# Patient Record
Sex: Male | Born: 1965 | Race: White | Hispanic: No | Marital: Married | Smoking: Never smoker
Health system: Southern US, Community
[De-identification: ages and names within clinical notes are randomized; demographics above are authoritative.]

## PROBLEM LIST (undated history)

## (undated) DIAGNOSIS — I10 Essential (primary) hypertension: Secondary | ICD-10-CM

## (undated) HISTORY — PX: CHOLECYSTECTOMY: SHX55

---

## 2006-11-30 ENCOUNTER — Ambulatory Visit: Payer: Self-pay

## 2008-04-29 ENCOUNTER — Emergency Department: Payer: Self-pay | Admitting: Emergency Medicine

## 2009-12-17 ENCOUNTER — Ambulatory Visit: Payer: Self-pay

## 2010-04-23 ENCOUNTER — Ambulatory Visit: Payer: Self-pay | Admitting: Internal Medicine

## 2010-05-14 ENCOUNTER — Ambulatory Visit: Payer: Self-pay | Admitting: Internal Medicine

## 2010-05-23 ENCOUNTER — Ambulatory Visit: Payer: Self-pay | Admitting: Internal Medicine

## 2010-05-30 ENCOUNTER — Ambulatory Visit: Payer: Self-pay | Admitting: Internal Medicine

## 2010-06-23 ENCOUNTER — Ambulatory Visit: Payer: Self-pay | Admitting: Internal Medicine

## 2010-10-16 ENCOUNTER — Emergency Department: Payer: Self-pay | Admitting: Emergency Medicine

## 2011-02-02 ENCOUNTER — Ambulatory Visit: Payer: Self-pay | Admitting: Internal Medicine

## 2011-02-03 ENCOUNTER — Ambulatory Visit: Payer: Self-pay | Admitting: Internal Medicine

## 2011-02-13 ENCOUNTER — Ambulatory Visit: Payer: Self-pay | Admitting: Unknown Physician Specialty

## 2011-02-17 LAB — PATHOLOGY REPORT

## 2011-02-19 ENCOUNTER — Ambulatory Visit: Payer: Self-pay | Admitting: Surgery

## 2011-02-20 LAB — PATHOLOGY REPORT

## 2012-06-28 IMAGING — NM NUCLEAR MEDICINE HEPATOHBILIARY INCLUDE GB
5 series · 32 of 32 positions shown · non-contrast
Comparison: none

REASON FOR EXAM: abd pain mid
COMMENTS:

[Series 1000: gallbladder statics · 4.80mm/px · 1 of 1 slices shown (1 of 2)]
[im 1/1]
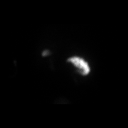

[Series 1000: gallbladder statics · 4.80mm/px · 9 of 9 slices shown (2 of 2)]
[im 1/9]
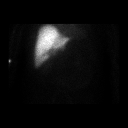
[im 2/9]
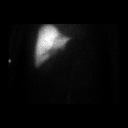
[im 3/9]
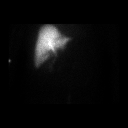
[im 4/9]
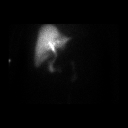
[im 5/9]
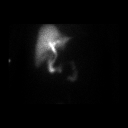
[im 6/9]
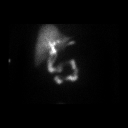
[im 7/9]
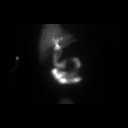
[im 8/9]
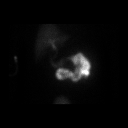
[im 9/9]
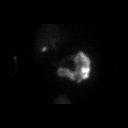

[Series 1000: gallbladder dynamic (results) · 4.80mm/px · 6 of 60 frames shown]
[frame 6/60]
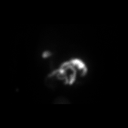
[frame 16/60]
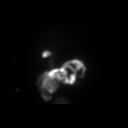
[frame 26/60]
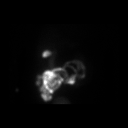
[frame 36/60]
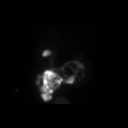
[frame 46/60]
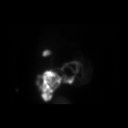
[frame 56/60]
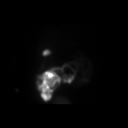

[Series 1000: gallbladder dynamic · 4.80mm/px · 6 of 60 frames shown]
[frame 6/60]
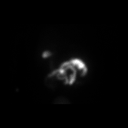
[frame 16/60]
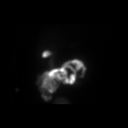
[frame 26/60]
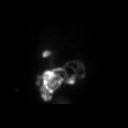
[frame 36/60]
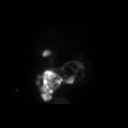
[frame 46/60]
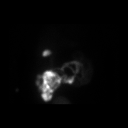
[frame 56/60]
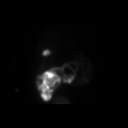

[Series 1000: gallbladder statics (concatenated) · 4.80mm/px · 10 of 10 slices shown]
[im 1/10]
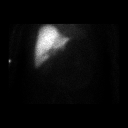
[im 2/10]
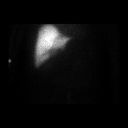
[im 3/10]
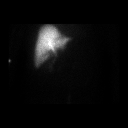
[im 4/10]
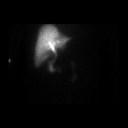
[im 5/10]
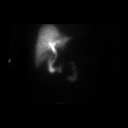
[im 6/10]
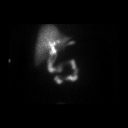
[im 7/10]
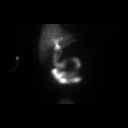
[im 8/10]
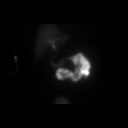
[im 9/10]
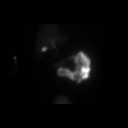
[im 10/10]
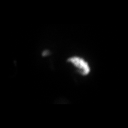

[32 of 32 positions shown; findings below may reference images not displayed]

PROCEDURE:     NM  - NM HEPATO WITH GB EJECT FRACTION  - February 03, 2011 [DATE]

RESULT:     Following intravenous administration of 8.16 mCi technetium 99m
Choletec, there is noted prompt visualization of tracer activity in the
liver at 3 minutes. At 70 minutes tracer activity is visualized in the
gallbladder, common duct and small bowel.

The gallbladder ejection fraction 30 minutes following instillation of a
sincalide drip measured 21%, which is below the normal range.
IMPRESSION: 1. There is visualization of tracer activity in the gallbladder, but
visualization is delayed and not seen until 70 minutes.
2. The gallbladder ejection fraction measures 21%, which is below the normal
range.

## 2014-05-07 ENCOUNTER — Emergency Department: Payer: Self-pay | Admitting: Emergency Medicine

## 2014-05-07 LAB — URINALYSIS, COMPLETE
BACTERIA: NONE SEEN
BLOOD: NEGATIVE
Bilirubin,UR: NEGATIVE
Glucose,UR: NEGATIVE mg/dL (ref 0–75)
KETONE: NEGATIVE
Leukocyte Esterase: NEGATIVE
NITRITE: NEGATIVE
Ph: 6 (ref 4.5–8.0)
Protein: NEGATIVE
RBC,UR: NONE SEEN /HPF (ref 0–5)
SQUAMOUS EPITHELIAL: NONE SEEN
Specific Gravity: 1.003 (ref 1.003–1.030)

## 2014-05-07 LAB — LIPASE, BLOOD: Lipase: 152 U/L (ref 73–393)

## 2014-05-07 LAB — CBC
HCT: 46.8 % (ref 40.0–52.0)
HGB: 15.9 g/dL (ref 13.0–18.0)
MCH: 30.1 pg (ref 26.0–34.0)
MCHC: 33.9 g/dL (ref 32.0–36.0)
MCV: 89 fL (ref 80–100)
PLATELETS: 132 10*3/uL — AB (ref 150–440)
RBC: 5.27 10*6/uL (ref 4.40–5.90)
RDW: 13.9 % (ref 11.5–14.5)
WBC: 5.5 10*3/uL (ref 3.8–10.6)

## 2014-05-07 LAB — COMPREHENSIVE METABOLIC PANEL
ALBUMIN: 3.9 g/dL (ref 3.4–5.0)
ALT: 70 U/L (ref 12–78)
ANION GAP: 7 (ref 7–16)
Alkaline Phosphatase: 76 U/L
BILIRUBIN TOTAL: 0.6 mg/dL (ref 0.2–1.0)
BUN: 11 mg/dL (ref 7–18)
CALCIUM: 8.6 mg/dL (ref 8.5–10.1)
CREATININE: 1.35 mg/dL — AB (ref 0.60–1.30)
Chloride: 107 mmol/L (ref 98–107)
Co2: 27 mmol/L (ref 21–32)
EGFR (African American): >60
GLUCOSE: 76 mg/dL (ref 65–99)
OSMOLALITY: 279 (ref 275–301)
POTASSIUM: 3.5 mmol/L (ref 3.5–5.1)
SGOT(AST): 64 U/L — ABNORMAL HIGH (ref 15–37)
Sodium: 141 mmol/L (ref 136–145)
Total Protein: 7.1 g/dL (ref 6.4–8.2)

## 2014-05-07 LAB — CK TOTAL AND CKMB (NOT AT ARMC)
CK, Total: 85 U/L
CK-MB: 0.6 ng/mL (ref 0.5–3.6)

## 2014-05-07 LAB — TROPONIN I

## 2014-08-23 ENCOUNTER — Ambulatory Visit: Payer: Self-pay | Admitting: Physician Assistant

## 2014-08-23 LAB — RAPID INFLUENZA A&B ANTIGENS (ARMC ONLY)

## 2014-08-23 LAB — RAPID STREP-A WITH REFLX: MICRO TEXT REPORT: NEGATIVE

## 2014-08-26 LAB — BETA STREP CULTURE(ARMC)

## 2015-09-30 IMAGING — CR DG CHEST 1V PORT
1 series · 1 of 1 positions shown · non-contrast
Comparison: 10/16/2010.

CLINICAL DATA: Chief Complaint Quote/Onset: Chief Complaint
Quote/Onset: : Patient to ER for c/o chest pain since last night.
States he had diaphoretic episode last night. Denies any N/V at any
point. Reports minimal shortness of breath. Has h/o R BBB and HTN

EXAM:
PORTABLE CHEST - 1 VIEW

[ap]
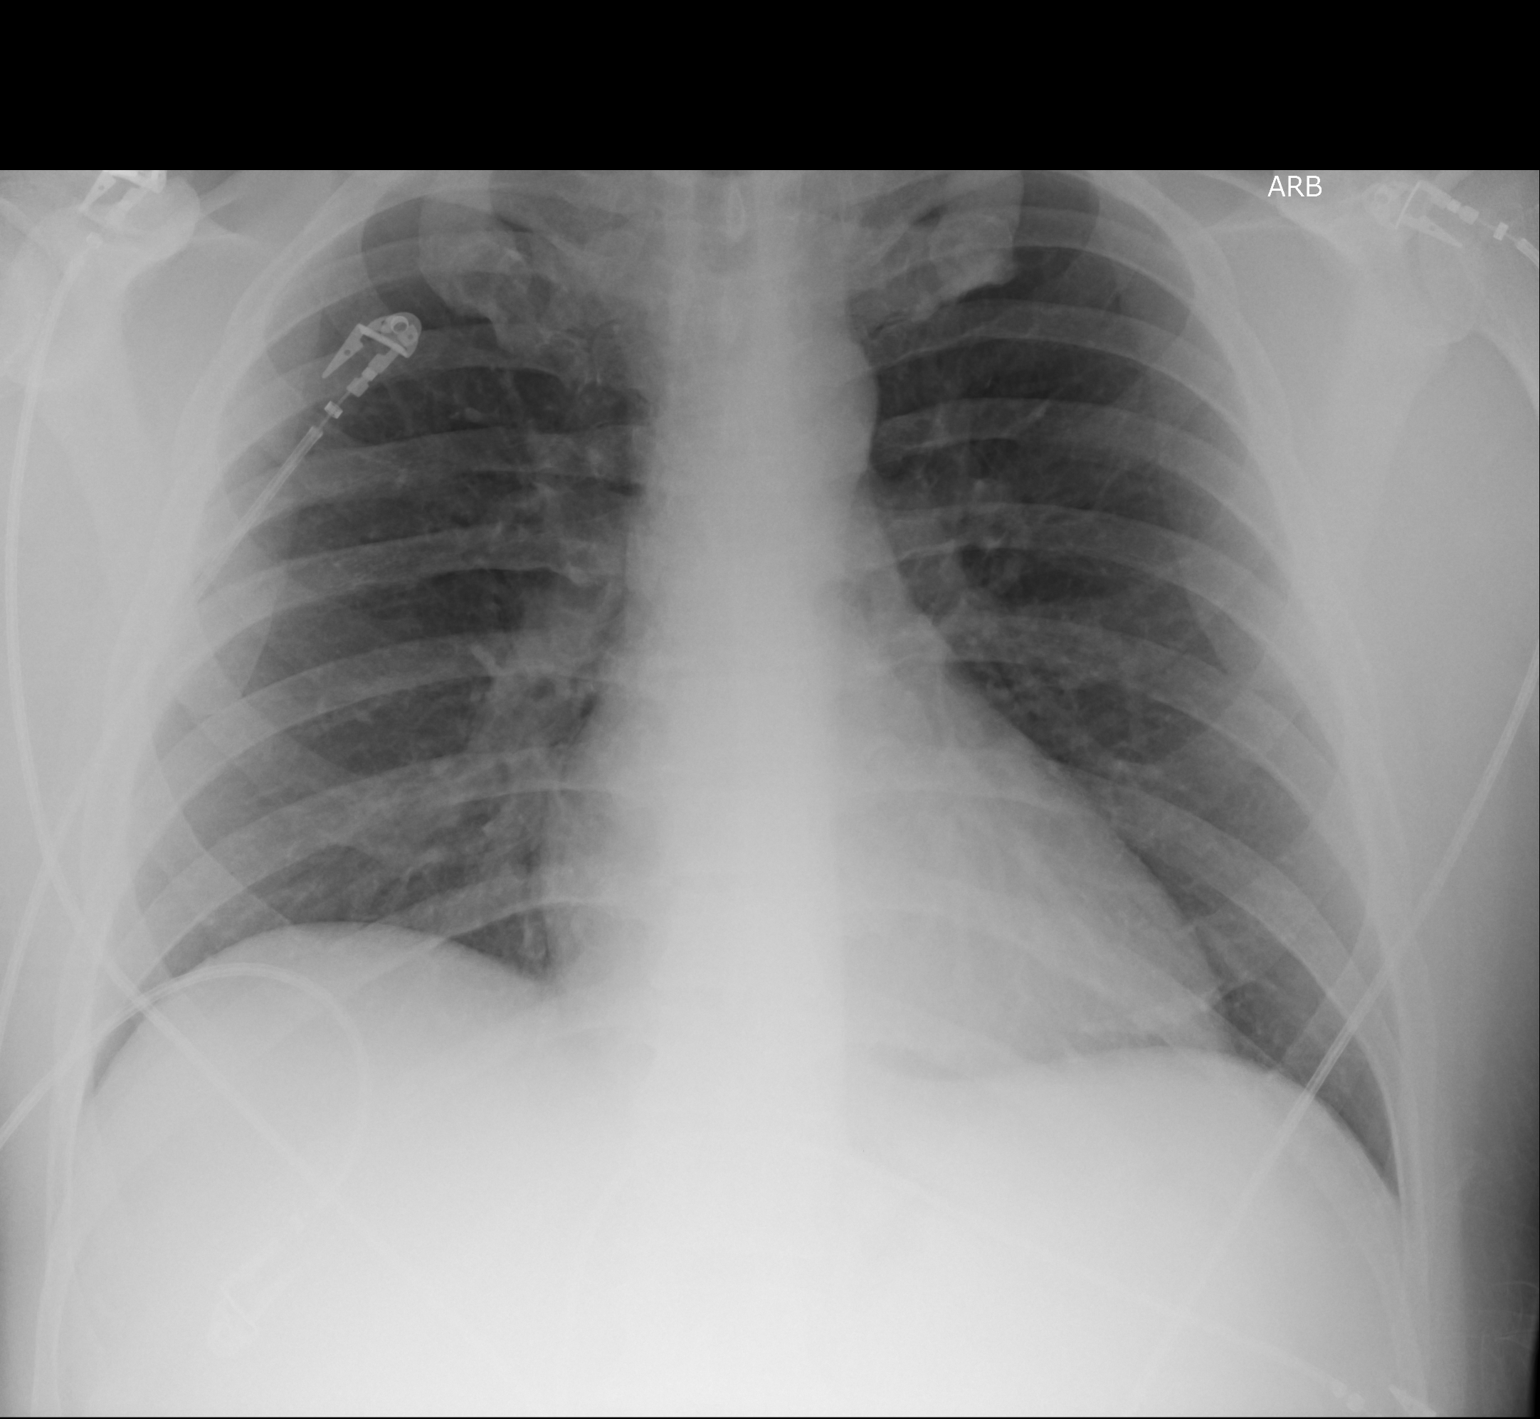

[1 of 1 positions shown; findings below may reference images not displayed]

FINDINGS: Cardiopericardial silhouette within normal limits. Mediastinal
contours normal. Trachea midline. No airspace disease or effusion.
Monitoring leads project over the chest.
IMPRESSION: No active disease.

## 2018-03-01 ENCOUNTER — Encounter: Payer: Self-pay | Admitting: *Deleted

## 2020-04-15 ENCOUNTER — Other Ambulatory Visit: Payer: Self-pay | Admitting: Internal Medicine

## 2020-07-16 ENCOUNTER — Other Ambulatory Visit: Payer: Self-pay | Admitting: Internal Medicine

## 2020-07-17 ENCOUNTER — Other Ambulatory Visit: Payer: Self-pay | Admitting: Internal Medicine

## 2020-10-30 ENCOUNTER — Ambulatory Visit
Admission: EM | Admit: 2020-10-30 | Discharge: 2020-10-30 | Disposition: A | Discharge status: Home / Self Care | Payer: BC Managed Care – PPO | Attending: Family Medicine | Admitting: Family Medicine

## 2020-10-30 ENCOUNTER — Other Ambulatory Visit: Payer: Self-pay

## 2020-10-30 ENCOUNTER — Encounter: Payer: Self-pay | Admitting: Emergency Medicine

## 2020-10-30 DIAGNOSIS — M79661 Pain in right lower leg: Secondary | ICD-10-CM

## 2020-10-30 HISTORY — DX: Essential (primary) hypertension: I10

## 2020-10-30 MED ORDER — MELOXICAM 15 MG PO TABS: 15.0000 mg | ORAL_TABLET | Freq: Every day | ORAL | 0 refills | Status: AC | PRN

## 2020-10-30 NOTE — Discharge Instructions (Signed)
Rest, ice, elevate. ° °Medication as prescribed. ° °Take care ° °Dr. Akesha Uresti  °
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
## 2020-12-12 ENCOUNTER — Other Ambulatory Visit: Payer: Self-pay | Admitting: Internal Medicine

## 2020-12-18 ENCOUNTER — Other Ambulatory Visit: Payer: Self-pay

## 2020-12-18 ENCOUNTER — Encounter: Payer: Self-pay | Admitting: Emergency Medicine

## 2020-12-18 ENCOUNTER — Ambulatory Visit
Admission: EM | Admit: 2020-12-18 | Discharge: 2020-12-18 | Disposition: A | Discharge status: Home / Self Care | Payer: BC Managed Care – PPO | Attending: Physician Assistant | Admitting: Physician Assistant

## 2020-12-18 DIAGNOSIS — U071 COVID-19: Secondary | ICD-10-CM | POA: Diagnosis not present

## 2020-12-18 LAB — SARS CORONAVIRUS 2 (TAT 6-24 HRS): SARS Coronavirus 2: POSITIVE — AB

## 2020-12-18 NOTE — ED Triage Notes (Signed)
Patient here for COVID testing, wife positive for COVID.

## 2020-12-18 NOTE — Discharge Instructions (Signed)

## 2020-12-19 ENCOUNTER — Telehealth: Payer: Self-pay | Admitting: Physician Assistant

## 2020-12-19 NOTE — Telephone Encounter (Signed)
Called to discuss with Garey Ham about Covid symptoms and the use of sotrovimab, remdisivir or oral therapies for those with mild to moderate Covid symptoms and at a high risk of hospitalization.     Pt does not qualify as pt's symptoms first presented > 10 days prior to timing of infusion. Symptoms tier reviewed as well as criteria for ending isolation. Preventative practices reviewed. Patient verbalized understanding     There are no problems to display for this patient.   Cline Crock PA-C

## 2024-10-05 ENCOUNTER — Encounter
